# Patient Record
Sex: Female | Born: 1970 | Race: White | Hispanic: No | Marital: Single | Smoking: Never smoker
Health system: Southern US, Community
[De-identification: ages and names within clinical notes are randomized; demographics above are authoritative.]

## PROBLEM LIST (undated history)

## (undated) DIAGNOSIS — E039 Hypothyroidism, unspecified: Secondary | ICD-10-CM

## (undated) HISTORY — DX: Hypothyroidism, unspecified: E03.9

---

## 2006-05-04 ENCOUNTER — Ambulatory Visit: Payer: Self-pay | Admitting: Internal Medicine

## 2006-05-04 LAB — CONVERTED CEMR LAB: TSH: 0.79 microintl units/mL (ref 0.35–5.50)

## 2006-05-06 DIAGNOSIS — E039 Hypothyroidism, unspecified: Secondary | ICD-10-CM | POA: Insufficient documentation

## 2006-05-16 ENCOUNTER — Ambulatory Visit: Payer: Self-pay | Admitting: Internal Medicine

## 2006-05-18 ENCOUNTER — Ambulatory Visit: Payer: Self-pay

## 2007-03-11 ENCOUNTER — Ambulatory Visit: Payer: Self-pay | Admitting: Family Medicine

## 2007-05-01 ENCOUNTER — Telehealth: Payer: Self-pay | Admitting: Internal Medicine

## 2007-06-10 ENCOUNTER — Ambulatory Visit: Payer: Self-pay | Admitting: Internal Medicine

## 2007-06-10 DIAGNOSIS — G43909 Migraine, unspecified, not intractable, without status migrainosus: Secondary | ICD-10-CM | POA: Insufficient documentation

## 2007-06-11 LAB — CONVERTED CEMR LAB
AST: 19 units/L (ref 0–37)
Alkaline Phosphatase: 51 units/L (ref 39–117)
Bilirubin, Direct: 0.1 mg/dL (ref 0.0–0.3)
Chloride: 110 meq/L (ref 96–112)
Eosinophils Absolute: 0.1 10*3/uL (ref 0.0–0.7)
GFR calc Af Amer: 82 mL/min
GFR calc non Af Amer: 67 mL/min
HCT: 38.5 % (ref 36.0–46.0)
Monocytes Absolute: 0.5 10*3/uL (ref 0.1–1.0)
Monocytes Relative: 8.2 % (ref 3.0–12.0)
Neutrophils Relative %: 63.1 % (ref 43.0–77.0)
Platelets: 209 10*3/uL (ref 150–400)
Potassium: 4.4 meq/L (ref 3.5–5.1)
RDW: 13.5 % (ref 11.5–14.6)
Sodium: 141 meq/L (ref 135–145)
WBC: 6.1 10*3/uL (ref 4.5–10.5)

## 2008-01-03 ENCOUNTER — Ambulatory Visit: Payer: Self-pay | Admitting: Internal Medicine

## 2008-06-01 ENCOUNTER — Ambulatory Visit: Payer: Self-pay | Admitting: Internal Medicine

## 2008-08-26 ENCOUNTER — Telehealth: Payer: Self-pay | Admitting: Internal Medicine

## 2009-06-15 ENCOUNTER — Ambulatory Visit: Payer: Self-pay | Admitting: Internal Medicine

## 2009-06-15 LAB — CONVERTED CEMR LAB: Rapid Strep: NEGATIVE

## 2009-08-16 ENCOUNTER — Encounter: Payer: Self-pay | Admitting: Internal Medicine

## 2009-09-08 ENCOUNTER — Ambulatory Visit: Payer: Self-pay | Admitting: Internal Medicine

## 2009-09-08 DIAGNOSIS — G4700 Insomnia, unspecified: Secondary | ICD-10-CM | POA: Insufficient documentation

## 2009-09-09 LAB — CONVERTED CEMR LAB
AST: 14 units/L (ref 0–37)
BUN: 11 mg/dL (ref 6–23)
Basophils Absolute: 0 10*3/uL (ref 0.0–0.1)
Bilirubin, Direct: 0.1 mg/dL (ref 0.0–0.3)
Calcium: 9.3 mg/dL (ref 8.4–10.5)
Cholesterol: 165 mg/dL (ref 0–200)
Creatinine, Ser: 0.9 mg/dL (ref 0.4–1.2)
GFR calc non Af Amer: 73.91 mL/min (ref >60)
Glucose, Bld: 76 mg/dL (ref 70–99)
HCT: 40.5 % (ref 36.0–46.0)
HDL: 49.9 mg/dL (ref >39.00)
LDL Cholesterol: 91 mg/dL (ref 0–99)
Lymphocytes Relative: 27.3 % (ref 12.0–46.0)
Lymphs Abs: 1.3 10*3/uL (ref 0.7–4.0)
Monocytes Relative: 10.1 % (ref 3.0–12.0)
Neutrophils Relative %: 60.8 % (ref 43.0–77.0)
Platelets: 201 10*3/uL (ref 150.0–400.0)
RDW: 13.7 % (ref 11.5–14.6)
TSH: 2.13 microintl units/mL (ref 0.35–5.50)
Total Bilirubin: 0.5 mg/dL (ref 0.3–1.2)
Triglycerides: 121 mg/dL (ref 0.0–149.0)
VLDL: 24.2 mg/dL (ref 0.0–40.0)

## 2009-11-30 ENCOUNTER — Telehealth: Payer: Self-pay | Admitting: Internal Medicine

## 2010-03-01 NOTE — Assessment & Plan Note (Signed)
Summary: SORE THROAT // RS   Vital Signs:  Patient profile:   40 year old female Temp:     98.2 degrees F oral Pulse rate:   76 / minute Pulse rhythm:   regular Resp:     12 per minute BP sitting:   90 / 62  (left arm) Cuff size:   regular  Vitals Entered By: Gladis Riffle, RN (Jun 15, 2009 9:53 AM) CC: c/o sore throat since 5/15, getting worse each day Is Patient Diabetic? No   CC:  c/o sore throat since 5/15 and getting worse each day.  History of Present Illness: ST 4 days progressively worsening no fever hnurts to swallos hurts to talk ibuprofen without results   Preventive Screening-Counseling & Management  Alcohol-Tobacco     Smoking Status: never  Current Problems (verified): 1)  Migraine Headache  (ICD-346.90) 2)  Hypothyroidism  (ICD-244.9)  Current Medications (verified): 1)  Levoxyl 75 Mcg Tabs (Levothyroxine Sodium) .... Take 1 Tablet By Mouth Once A Day 2)  Juice Plus Fibre   Liqd (Nutritional Supplements) .Marland Kitchen.. 1 By Mouth Once Daily 3)  Osteo Bi-Flex Regular Strength 250-200 Mg Tabs (Glucosamine-Chondroitin) .... Two Capsules Per Day  Allergies: 1)  ! Penicillin V Potassium (Penicillin V Potassium) 2)  ! Codeine Sulfate (Codeine Sulfate) 3)  ! 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   Head:  normocephalic and atraumatic.   Mouth:  tonsils large and red no exudate Neck:   supple and full ROM.   Lungs:  normal respiratory effort and no intercostal retractions.   Abdomen:  soft and non-tender.   Cervical Nodes:  no anterior cervical adenopathy and no posterior cervical adenopathy.     Impression & Recommendations:  Problem # 1:  URI (ICD-465.9) will check strep and then make determination strep negative will treat sympotms call if sxs persist/worsen  Complete Medication List: 1)  Levoxyl 75 Mcg Tabs (Levothyroxine sodium) .... Take 1 tablet by mouth once a day 2)  Juice Plus Fibre Liqd (Nutritional supplements) .Marland Kitchen.. 1 by mouth once daily 3)  Osteo Bi-flex Regular Strength 250-200 Mg Tabs (Glucosamine-chondroitin) .... Two capsules per day 4)  Lidocaine Viscous 2 % Soln (Lidocaine hcl) .... Gargle and spit three times a day as needed sore throat  Other Orders: Rapid Strep (21308) Prescriptions: LIDOCAINE VISCOUS 2 % SOLN (LIDOCAINE HCL) gargle and spit three times a day as needed sore throat  #240 cc x 1   Entered by:   Gladis Riffle, RN   Authorized by:   Birdie Sons MD   Signed by:   Gladis Riffle, RN on 06/15/2009   Method used:   [facility]        [facility] [phone][facility] (retail)       [address]       [place], [place]  [address]       Ph: [phone]       Fax: [phone]   RxID:  [id number]   Laboratory Results  Date/Time Received: Jun 15, 2009 10:41 AM  Date/Time Reported: Jun 15, 2009 10:41 AM   Other Tests  Rapid Strep: negative Comments Wynona Canes, CMA  Jun 15, 2009 10:41 AM

## 2010-03-01 NOTE — Assessment & Plan Note (Signed)
Summary: med check/refill/pt coming in fasting/cjr/pt rsc/cjr   Vital Signs:  Patient profile:   40 year old female Weight:      133 pounds BMI:     21.54 Temp:     98.6 degrees F oral Pulse rate:   64 / minute Pulse rhythm:   regular Resp:     12 per minute BP sitting:   96 / 68  (left arm) Cuff size:   regular  Vitals Entered By: Gladis Riffle, RN (September 08, 2009 8:07 AM) CC: medication review and refill; c/o not sleeping--fasting Is Patient Diabetic? No   CC:  medication review and refill; c/o not sleeping--fasting.  History of Present Illness:  Follow-Up Visit      This is a 40 year old woman who presents for Follow-up visit.  The patient denies chest pain and palpitations.  Since the last visit the patient notes no new problems or concerns, except has had trouble sleeping for the past month. Describes both prolonged sleep latency and early awakenings.  The patient reports taking meds as prescribed.  When questioned about possible medication side effects, the patient notes none.    All other systems reviewed and were negative   Preventive Screening-Counseling & Management  Alcohol-Tobacco     Smoking Status: never  Current Problems (verified): 1)  Migraine Headache  (ICD-346.90) 2)  Hypothyroidism  (ICD-244.9)  Current Medications (verified): 1)  Levoxyl 75 Mcg Tabs (Levothyroxine Sodium) .... Take 1 Tablet By Mouth Once A Day 2)  Juice Plus Fibre   Liqd (Nutritional Supplements) .Marland Kitchen.. 1 By Mouth Once Daily 3)  Osteo Bi-Flex Regular Strength 250-200 Mg Tabs (Glucosamine-Chondroitin) .... Two Capsules Per Day  Allergies: 1)  ! Penicillin V Potassium (Penicillin V Potassium) 2)  ! Codeine Sulfate (Codeine Sulfate) 3)  ! 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   Head:  normocephalic and atraumatic.   Eyes:  pupils equal and pupils round.   Ears:  R ear normal and L ear normal.   Neck:  supple and full ROM.   Lungs:  normal respiratory effort and no intercostal retractions.   Heart:  normal rate and regular rhythm.     Impression & Recommendations:  Problem # 1:  HYPOTHYROIDISM (ICD-244.9)  needs labs Her updated medication list for this problem includes:    Levoxyl 75 Mcg Tabs (Levothyroxine sodium) .Marland Kitchen... Take 1 tablet by mouth once a day  Labs Reviewed: TSH: 0.74 (06/01/2008)     Problem # 2:  INSOMNIA-SLEEP DISORDER-UNSPEC (ICD-780.52) discussed sleep habits will try trazadone side effects disucssed  Complete Medication List: 1)  Levoxyl 75 Mcg Tabs (Levothyroxine sodium) .... Take 1 tablet by mouth once a day 2)  Juice Plus Fibre Liqd (Nutritional supplements) .Marland Kitchen.. 1 by mouth once daily 3)  Osteo Bi-flex Regular Strength 250-200 Mg Tabs (Glucosamine-chondroitin) .... Two capsules per day 4)  Trazodone Hcl 50 Mg Tabs (Trazodone hcl) .... 1/2 -1  by mouth at bedtime as needed insomnia  Other Orders: Venipuncture (16109) TLB-Lipid Panel (80061-LIPID) TLB-BMP (Basic Metabolic Panel-BMET) (80048-METABOL) TLB-CBC Platelet - w/Differential (85025-CBCD) TLB-Hepatic/Liver Function Pnl (80076-HEPATIC) TLB-TSH (Thyroid Stimulating  Hormone) (84443-TSH)  Patient Instructions: 1)  . Prescriptions: LEVOXYL 75 MCG TABS (LEVOTHYROXINE SODIUM) Take 1 tablet by mouth once a day Brand medically necessary #90 x 3   Entered and Authorized by:   Birdie Sons MD   Signed by:   Birdie Sons MD on 09/08/2009   Method used:   Faxed to ...       [facility]  (mail-order)             , [place]         Ph: [phone]       Fax: [phone]   RxID:   [id number] TRAZODONE HCL 50 MG TABS (TRAZODONE HCL) 1/2 -1  by mouth at bedtime as needed insomnia  #30 x 1   Entered and Authorized by:   Birdie Sons MD   Signed by:   Birdie Sons MD on 09/08/2009   Method used:   [facility] to        [facility] [phone][facility] (retail)       [address]       [place], [place]  [address]       Ph: [phone]       Fax: [phone]   RxID:   [id number]   Appended Document: Orders Update     Clinical Lists Changes  Orders: Added new Service order of Specimen Handling (06269) - Signed

## 2010-03-01 NOTE — Letter (Signed)
Summary: [facility]  [facility]   Imported By: Maryln Gottron 09/02/2009 15:55:20  _____________________________________________________________________  External Attachment:    Type:   Image     Comment:   External Document

## 2010-03-01 NOTE — Progress Notes (Signed)
Summary: refill  Phone Note Call from Patient Call back at Home Phone [phone]   Caller: Patient----triage vm Summary of Call: was rx'd Trazodone to help her sleep. requesting refills. Not sleeping as well as she should be. please call her back. Initial call taken by: Warnell Forester,  November 30, 2009 9:43 AM  Follow-up for Phone Call        Phone Call Completed, Rx Called In Follow-up by: Alfred Levins, CMA,  November 30, 2009 12:01 PM    Prescriptions: TRAZODONE HCL 50 MG TABS (TRAZODONE HCL) 1/2 -1  by mouth at bedtime as needed insomnia  #30 x 1   Entered by:   Alfred Levins, CMA   Authorized by:   Birdie Sons MD   Signed by:   Alfred Levins, CMA on 11/30/2009   Method used:   [facility] to        [facility] [phone]* (retail)       [address]       [place], [place]  [address]       Ph: [phone]       Fax: [phone]   RxID:   [id number]

## 2010-08-23 ENCOUNTER — Encounter: Payer: Self-pay | Admitting: Internal Medicine

## 2010-08-29 ENCOUNTER — Ambulatory Visit: Payer: Self-pay | Admitting: Internal Medicine

## 2010-08-31 ENCOUNTER — Telehealth: Payer: Self-pay | Admitting: Internal Medicine

## 2010-08-31 MED ORDER — LEVOTHYROXINE SODIUM 75 MCG PO TABS: 75.0000 ug | ORAL_TABLET | Freq: Every day | ORAL | Status: DC

## 2010-08-31 NOTE — Telephone Encounter (Signed)
rx sent in electronically 

## 2010-08-31 NOTE — Telephone Encounter (Signed)
Pt requesting refill on levothyroxine (SYNTHROID, LEVOTHROID) 75 MCG tablet   Send to [facility] home delivery

## 2010-09-01 ENCOUNTER — Ambulatory Visit: Payer: Self-pay | Admitting: Internal Medicine

## 2010-09-01 ENCOUNTER — Other Ambulatory Visit: Payer: Self-pay | Admitting: *Deleted

## 2010-09-01 MED ORDER — LEVOTHYROXINE SODIUM 75 MCG PO TABS: 75.0000 ug | ORAL_TABLET | Freq: Every day | ORAL | Status: DC

## 2010-11-02 ENCOUNTER — Encounter: Payer: Self-pay | Admitting: Internal Medicine

## 2010-11-02 ENCOUNTER — Ambulatory Visit (INDEPENDENT_AMBULATORY_CARE_PROVIDER_SITE_OTHER): Payer: Managed Care, Other (non HMO) | Admitting: Internal Medicine

## 2010-11-02 DIAGNOSIS — E039 Hypothyroidism, unspecified: Secondary | ICD-10-CM

## 2010-11-02 DIAGNOSIS — Z0000 Encounter for general adult medical examination without abnormal findings: Secondary | ICD-10-CM

## 2010-11-02 LAB — BASIC METABOLIC PANEL
BUN: 25 mg/dL — ABNORMAL HIGH (ref 6–23)
Calcium: 9.2 mg/dL (ref 8.4–10.5)
Chloride: 105 mEq/L (ref 96–112)
Creatinine, Ser: 0.9 mg/dL (ref 0.4–1.2)
GFR: 74.4 mL/min (ref >60.00)
Glucose, Bld: 75 mg/dL (ref 70–99)
Potassium: 4.1 mEq/L (ref 3.5–5.1)
Sodium: 140 mEq/L (ref 135–145)

## 2010-11-02 LAB — CBC WITH DIFFERENTIAL/PLATELET
Basophils Absolute: 0 10*3/uL (ref 0.0–0.1)
HCT: 41.5 % (ref 36.0–46.0)
Hemoglobin: 13.7 g/dL (ref 12.0–15.0)
Lymphs Abs: 1.5 10*3/uL (ref 0.7–4.0)
MCV: 90.2 fl (ref 78.0–100.0)
Monocytes Absolute: 0.3 10*3/uL (ref 0.1–1.0)
Monocytes Relative: 7.3 % (ref 3.0–12.0)
Neutro Abs: 1.9 10*3/uL (ref 1.4–7.7)
RDW: 13.3 % (ref 11.5–14.6)

## 2010-11-02 LAB — LIPID PANEL
Cholesterol: 200 mg/dL (ref 0–200)
HDL: 78.8 mg/dL (ref >39.00)
Total CHOL/HDL Ratio: 3
Triglycerides: 97 mg/dL (ref 0.0–149.0)

## 2010-11-02 LAB — HEPATIC FUNCTION PANEL
Albumin: 4.1 g/dL (ref 3.5–5.2)
Alkaline Phosphatase: 59 U/L (ref 39–117)
Total Protein: 7.3 g/dL (ref 6.0–8.3)

## 2010-11-02 LAB — TSH: TSH: 0.64 u[IU]/mL (ref 0.35–5.50)

## 2010-11-02 NOTE — Progress Notes (Signed)
  Subjective:    Patient ID: Susan Gonzalez, female    DOB: [birth year], 40 y.o.   MRN: [id number]  HPI  Hypothyroid---taking replacement She really has no other complaints.  Past Medical History  Diagnosis Date  . Hypothyroidism    No past surgical history on file.  reports that she has never smoked. She does not have any smokeless tobacco history on file. She reports that she drinks alcohol. Her drug history not on file. family history includes Cancer in her maternal grandmother; Hypertension in her father and mother; and Stroke in her paternal grandfather and paternal grandmother. Allergies  Allergen Reactions  . Codeine Sulfate     REACTION: unspecified  . Cyclobenzaprine Hcl     REACTION: migraine  . Penicillins     REACTION: unspecified     Review of Systems    patient denies chest pain, shortness of breath, orthopnea. Denies lower extremity edema, abdominal pain, change in appetite, change in bowel movements. Patient denies rashes, musculoskeletal complaints. No other specific complaints in a complete review of systems.    Objective:   Physical Exam   Well-developed well-nourished female in no acute distress. HEENT exam atraumatic, normocephalic, extraocular muscles are intact. Neck is supple. No jugular venous distention no thyromegaly. Chest clear to auscultation without increased work of breathing. Cardiac exam S1 and S2 are regular. Abdominal exam active bowel sounds, soft, nontender. Extremities no edema.      Assessment & Plan:

## 2010-11-02 NOTE — Assessment & Plan Note (Signed)
Lab Results  Component Value Date   TSH 2.13 09/08/2009   Need f/u today

## 2011-07-11 ENCOUNTER — Telehealth: Payer: Self-pay | Admitting: Internal Medicine

## 2011-07-11 MED ORDER — LEVOTHYROXINE SODIUM 75 MCG PO TABS: 75.0000 ug | ORAL_TABLET | Freq: Every day | ORAL | Status: DC

## 2011-07-11 NOTE — Telephone Encounter (Signed)
Pt called req refill of levothyroxine (SYNTHROID, LEVOTHROID) [facility] MCG tablet [facility]. Pt is out of med.

## 2011-07-11 NOTE — Telephone Encounter (Signed)
rx sent in electronically 

## 2012-01-09 ENCOUNTER — Telehealth: Payer: Self-pay | Admitting: Internal Medicine

## 2012-01-09 DIAGNOSIS — E039 Hypothyroidism, unspecified: Secondary | ICD-10-CM

## 2012-01-09 MED ORDER — LEVOTHYROXINE SODIUM 75 MCG PO TABS: 75.0000 ug | ORAL_TABLET | Freq: Every day | ORAL | Status: DC

## 2012-01-09 NOTE — Telephone Encounter (Signed)
rx sent in electronically 

## 2012-01-09 NOTE — Telephone Encounter (Signed)
Pt has appt scheduled 12/20 for follow up thyroid medication.  She will run out of medication before appt and is requesting a refill of levothyroxine tab [facility] [address].

## 2012-01-18 ENCOUNTER — Ambulatory Visit (INDEPENDENT_AMBULATORY_CARE_PROVIDER_SITE_OTHER): Payer: Managed Care, Other (non HMO) | Admitting: Internal Medicine

## 2012-01-18 ENCOUNTER — Encounter: Payer: Self-pay | Admitting: Internal Medicine

## 2012-01-18 VITALS — BP 108/66 | HR 56 | Temp 98.0°F | Wt 143.0 lb

## 2012-01-18 DIAGNOSIS — G4700 Insomnia, unspecified: Secondary | ICD-10-CM

## 2012-01-18 DIAGNOSIS — E039 Hypothyroidism, unspecified: Secondary | ICD-10-CM

## 2012-01-18 MED ORDER — ZALEPLON 5 MG PO CAPS: 5.0000 mg | ORAL_CAPSULE | Freq: Every day | ORAL | Status: DC

## 2012-01-18 NOTE — Assessment & Plan Note (Signed)
Doing well. Check labs today.

## 2012-01-18 NOTE — Assessment & Plan Note (Signed)
Continues to be a problem. I'm going to give her Sonata. She will call me if her symptoms persist. She's given detailed instructions on how to use Sonata an as-needed basis.

## 2012-01-18 NOTE — Progress Notes (Signed)
Patient ID: Susan Gonzalez, female   DOB: 1971-01-04, 41 y.o.   MRN: [id number] Thyroid- needs f/u  Sleep- intermittent poor sleep. Affects work Affects 5/7 nights.  She does not nap, she exercises a lot.  Reviewed pmh, psh, sochx

## 2012-01-19 ENCOUNTER — Ambulatory Visit: Payer: Managed Care, Other (non HMO) | Admitting: Internal Medicine

## 2012-02-14 ENCOUNTER — Other Ambulatory Visit: Payer: Self-pay | Admitting: Internal Medicine

## 2012-03-17 ENCOUNTER — Other Ambulatory Visit: Payer: Self-pay | Admitting: Internal Medicine

## 2012-04-30 ENCOUNTER — Other Ambulatory Visit: Payer: Self-pay | Admitting: Internal Medicine

## 2012-06-13 ENCOUNTER — Other Ambulatory Visit: Payer: Self-pay | Admitting: Internal Medicine

## 2012-08-05 ENCOUNTER — Other Ambulatory Visit: Payer: Self-pay | Admitting: Internal Medicine

## 2012-12-03 ENCOUNTER — Telehealth: Payer: Self-pay | Admitting: Internal Medicine

## 2012-12-03 NOTE — Telephone Encounter (Signed)
Pt would like to be tested for add. Would like referral to a doc that will do this

## 2012-12-03 NOTE — Telephone Encounter (Signed)
Psychology referral for  ADD Susan Gonzalez

## 2012-12-04 NOTE — Telephone Encounter (Signed)
Left message on machine For pt to return call-- pt makes own appointment- Susan Gonzalez telephone number is [phone]

## 2012-12-04 NOTE — Telephone Encounter (Signed)
Pt informed

## 2013-01-07 ENCOUNTER — Other Ambulatory Visit: Payer: Self-pay | Admitting: Internal Medicine

## 2013-02-12 ENCOUNTER — Ambulatory Visit: Payer: Managed Care, Other (non HMO) | Admitting: Internal Medicine

## 2013-02-21 ENCOUNTER — Ambulatory Visit: Payer: Managed Care, Other (non HMO) | Admitting: Internal Medicine

## 2013-05-07 ENCOUNTER — Telehealth: Payer: Self-pay | Admitting: Internal Medicine

## 2013-05-07 MED ORDER — LEVOTHYROXINE SODIUM 75 MCG PO TABS: ORAL_TABLET | ORAL | Status: DC

## 2013-05-07 NOTE — Telephone Encounter (Signed)
rx sent in electronically 

## 2013-05-07 NOTE — Telephone Encounter (Signed)
Pt needs refill of levothyroxine (SYNTHROID, LEVOTHROID) 75 MCG tablet [facility] [address] Pt has made appt w/ padonda for fup on meds on 4/21.  Can you refill  Until then?

## 2013-05-20 ENCOUNTER — Encounter: Payer: Self-pay | Admitting: Family

## 2013-05-20 ENCOUNTER — Ambulatory Visit (INDEPENDENT_AMBULATORY_CARE_PROVIDER_SITE_OTHER): Payer: Managed Care, Other (non HMO) | Admitting: Family

## 2013-05-20 VITALS — BP 100/60 | HR 70 | Wt 150.0 lb

## 2013-05-20 DIAGNOSIS — G4700 Insomnia, unspecified: Secondary | ICD-10-CM

## 2013-05-20 DIAGNOSIS — E039 Hypothyroidism, unspecified: Secondary | ICD-10-CM

## 2013-05-20 LAB — COMPREHENSIVE METABOLIC PANEL
ALT: 17 U/L (ref 0–35)
AST: 20 U/L (ref 0–37)
Albumin: 3.9 g/dL (ref 3.5–5.2)
Alkaline Phosphatase: 50 U/L (ref 39–117)
BUN: 20 mg/dL (ref 6–23)
CALCIUM: 9.1 mg/dL (ref 8.4–10.5)
CHLORIDE: 104 meq/L (ref 96–112)
CO2: 27 meq/L (ref 19–32)
CREATININE: 1 mg/dL (ref 0.4–1.2)
GFR: 65.78 mL/min (ref >60.00)
GLUCOSE: 73 mg/dL (ref 70–99)
Potassium: 3.8 mEq/L (ref 3.5–5.1)
Sodium: 139 mEq/L (ref 135–145)
Total Bilirubin: 0.6 mg/dL (ref 0.3–1.2)
Total Protein: 7 g/dL (ref 6.0–8.3)

## 2013-05-20 LAB — TSH: TSH: 0.82 u[IU]/mL (ref 0.35–5.50)

## 2013-05-20 MED ORDER — LEVOTHYROXINE SODIUM 75 MCG PO TABS: ORAL_TABLET | ORAL | Status: DC

## 2013-05-20 MED ORDER — ZALEPLON 5 MG PO CAPS: 5.0000 mg | ORAL_CAPSULE | Freq: Every day | ORAL | Status: DC

## 2013-05-20 NOTE — Progress Notes (Signed)
   Subjective:    Patient ID: Susan Gonzalez, female    DOB: 27-Jan-1971, 43 y.o.   MRN: [id number]  HPI  43 year old caucasian female, nonsmoker presenting today for f/u for hypothyroidism.  She is in office today because needs a refill.  She has been taking her medication as prescribed and is tolerating it well.      Review of Systems  Constitutional: Negative.  Negative for appetite change and unexpected weight change.  Respiratory: Negative.   Cardiovascular: Positive for leg swelling.       Right leg swells at times  Gastrointestinal: Negative.   Endocrine: Negative.  Negative for cold intolerance and heat intolerance.  Genitourinary: Negative.  Negative for menstrual problem.  Musculoskeletal: Negative.   Neurological: Negative.  Negative for light-headedness and headaches.  Hematological: Negative.   Psychiatric/Behavioral: Positive for sleep disturbance.       Problems with sleep   Past Medical History  Diagnosis Date  . Hypothyroidism     History   Social History  . Marital Status: Single    Spouse Name: N/A    Number of Children: N/A  . Years of Education: N/A   Occupational History  . Not on file.   Social History Main Topics  . Smoking status: Never Smoker   . Smokeless tobacco: Not on file  . Alcohol Use: Yes  . Drug Use: Not on file  . Sexual Activity: Not on file   Other Topics Concern  . Not on file   Social History Narrative  . No narrative on file    History reviewed. No pertinent past surgical history.  Family History  Problem Relation Age of Onset  . Hypertension Mother   . Hypertension Father   . Cancer Maternal Grandmother     breast  . Stroke Paternal Grandmother   . Stroke Paternal Grandfather     Allergies  Allergen Reactions  . Codeine Sulfate     REACTION: unspecified  . Cyclobenzaprine Hcl     REACTION: migraine  . Penicillins     REACTION: unspecified    No current outpatient prescriptions on file prior to visit.     No current facility-administered medications on file prior to visit.    BP 100/60  Pulse 70  Wt 150 lb (68.04 kg)  SpO2 98%chart    Objective:   Physical Exam  Constitutional: She is oriented to person, place, and time. She appears well-developed and well-nourished.  HENT:  Head: Normocephalic.  Neck: Normal range of motion. Neck supple.  Cardiovascular: Normal rate, regular rhythm and normal heart sounds.   Pulmonary/Chest: Effort normal and breath sounds normal.  Abdominal: Soft. Bowel sounds are normal.  Musculoskeletal: Normal range of motion.  Neurological: She is alert and oriented to person, place, and time. She has normal reflexes. No cranial nerve deficit.  Skin: Skin is warm and dry.  Psychiatric: She has a normal mood and affect. Her behavior is normal. Judgment and thought content normal.          Assessment & Plan:  Assessment 1. Hypothyroidism 2.Insomnia   Plan 1.Refills on synthroid and sonata. 2.Obtain lab that include TSH.  3.Schedule for physical over the next few months. 4. Contact office for questions or concerns.

## 2013-05-20 NOTE — Progress Notes (Signed)
Pre visit review using our clinic review tool, if applicable. No additional management support is needed unless otherwise documented below in the visit note. 

## 2013-05-20 NOTE — Patient Instructions (Signed)

## 2013-05-30 ENCOUNTER — Other Ambulatory Visit: Payer: Self-pay | Admitting: Internal Medicine

## 2013-08-08 ENCOUNTER — Ambulatory Visit (HOSPITAL_BASED_OUTPATIENT_CLINIC_OR_DEPARTMENT_OTHER)
Admission: RE | Admit: 2013-08-08 | Discharge: 2013-08-08 | Disposition: A | Discharge status: Home / Self Care | Payer: Managed Care, Other (non HMO) | Source: Ambulatory Visit | Attending: Family Medicine | Admitting: Family Medicine

## 2013-08-08 ENCOUNTER — Encounter: Payer: Self-pay | Admitting: Family Medicine

## 2013-08-08 ENCOUNTER — Ambulatory Visit (INDEPENDENT_AMBULATORY_CARE_PROVIDER_SITE_OTHER): Payer: Managed Care, Other (non HMO) | Admitting: Family Medicine

## 2013-08-08 VITALS — BP 100/68 | HR 65 | Temp 98.0°F | Resp 16 | Wt 150.1 lb

## 2013-08-08 DIAGNOSIS — M79609 Pain in unspecified limb: Secondary | ICD-10-CM | POA: Insufficient documentation

## 2013-08-08 DIAGNOSIS — M7989 Other specified soft tissue disorders: Secondary | ICD-10-CM | POA: Insufficient documentation

## 2013-08-08 MED ORDER — MELOXICAM 15 MG PO TABS: 15.0000 mg | ORAL_TABLET | Freq: Every day | ORAL | Status: DC

## 2013-08-08 NOTE — Progress Notes (Signed)
Pre visit review using our clinic review tool, if applicable. No additional management support is needed unless otherwise documented below in the visit note. 

## 2013-08-08 NOTE — Patient Instructions (Signed)
Follow up as needed Your ankle swelling appears to be bursitis- start the Mobic daily w/ food, ICE, elevate. We will call you with your US appt to assess the lump behind your knee Call with any questions or concerns Hang in there!!!

## 2013-08-08 NOTE — Progress Notes (Signed)
   Subjective:    Patient ID: Susan Gonzalez, female    DOB: [birth year], 43 y.o.   MRN: [id number]  HPI R foot and ankle swelling- sxs started ~2 weeks ago.  No known injury.  Swelling will occur at ankle both medially and laterally.  Has area of discoloration and TTP just inferior to medial malleolus.  Not painful to weight bear.  No change in footwear.  No change in activity level but pt is VERY active.  Pt has noted tender lump posterior to R knee but this predated the swelling.   Review of Systems For ROS see HPI     Objective:   Physical Exam  Vitals reviewed. Constitutional: She is oriented to person, place, and time. She appears well-developed and well-nourished. No distress.  Cardiovascular: Intact distal pulses.   Musculoskeletal: She exhibits edema (edema inferior to R medial malleolus consistent w/ bursitis) and tenderness (TTP over R medial malleolus bursa).  Small firm, mildly TTP area posterior to R knee possibly a Baker's cyst vs ganglion  Neurological: She is alert and oriented to person, place, and time. She has normal reflexes. No cranial nerve deficit. Coordination normal.  Skin: Skin is warm and dry. No erythema.  Psychiatric: She has a normal mood and affect. Her behavior is normal.          Assessment & Plan:

## 2013-08-10 NOTE — Assessment & Plan Note (Signed)
New.  Pt's lower leg swelling is consistent w/ bursitis but due to tender nodule posterior to R knee will get US to r/o DVT.  Start scheduled NSAIDs, ice, elevation, and good supportive shoes.  Reviewed supportive care and red flags that should prompt return.  Pt expressed understanding and is in agreement w/ plan.

## 2013-08-18 ENCOUNTER — Telehealth: Payer: Self-pay | Admitting: Family Medicine

## 2013-08-18 DIAGNOSIS — M6740 Ganglion, unspecified site: Secondary | ICD-10-CM

## 2013-08-18 NOTE — Telephone Encounter (Signed)
Caller name: Lawson FiscalLori  Call back [phone]   Reason for call:   Pt would like a referral to a specialist related to the OV on 7/10.  She does not have one in mind

## 2013-08-18 NOTE — Telephone Encounter (Signed)
Referral placed and pt notified

## 2013-08-22 LAB — HM PAP SMEAR: HM Pap smear: NORMAL

## 2013-08-22 LAB — HM MAMMOGRAPHY

## 2013-10-14 ENCOUNTER — Other Ambulatory Visit: Payer: Self-pay | Admitting: Internal Medicine

## 2014-02-26 ENCOUNTER — Other Ambulatory Visit: Payer: Self-pay | Admitting: Family

## 2014-06-11 ENCOUNTER — Other Ambulatory Visit: Payer: Self-pay | Admitting: Internal Medicine

## 2014-07-28 ENCOUNTER — Ambulatory Visit (INDEPENDENT_AMBULATORY_CARE_PROVIDER_SITE_OTHER): Payer: Managed Care, Other (non HMO) | Admitting: Family Medicine

## 2014-07-28 ENCOUNTER — Encounter: Payer: Self-pay | Admitting: Family Medicine

## 2014-07-28 VITALS — BP 100/68 | HR 61 | Temp 98.3°F | Resp 16 | Ht 66.0 in | Wt 158.2 lb

## 2014-07-28 DIAGNOSIS — E038 Other specified hypothyroidism: Secondary | ICD-10-CM

## 2014-07-28 DIAGNOSIS — R635 Abnormal weight gain: Secondary | ICD-10-CM

## 2014-07-28 DIAGNOSIS — R5383 Other fatigue: Secondary | ICD-10-CM | POA: Diagnosis not present

## 2014-07-28 LAB — CBC WITH DIFFERENTIAL/PLATELET
Basophils Absolute: 0 10*3/uL (ref 0.0–0.1)
Basophils Relative: 0.6 % (ref 0.0–3.0)
EOS PCT: 4.6 % (ref 0.0–5.0)
Eosinophils Absolute: 0.3 10*3/uL (ref 0.0–0.7)
HEMATOCRIT: 43.1 % (ref 36.0–46.0)
Hemoglobin: 14.5 g/dL (ref 12.0–15.0)
LYMPHS PCT: 27.6 % (ref 12.0–46.0)
Lymphs Abs: 2 10*3/uL (ref 0.7–4.0)
MCHC: 33.6 g/dL (ref 30.0–36.0)
MCV: 88.1 fl (ref 78.0–100.0)
MONOS PCT: 7.5 % (ref 3.0–12.0)
Monocytes Absolute: 0.5 10*3/uL (ref 0.1–1.0)
Neutro Abs: 4.3 10*3/uL (ref 1.4–7.7)
Neutrophils Relative %: 59.7 % (ref 43.0–77.0)
PLATELETS: 255 10*3/uL (ref 150.0–400.0)
RBC: 4.89 Mil/uL (ref 3.87–5.11)
RDW: 14.8 % (ref 11.5–15.5)
WBC: 7.3 10*3/uL (ref 4.0–10.5)

## 2014-07-28 LAB — BASIC METABOLIC PANEL
BUN: 30 mg/dL — ABNORMAL HIGH (ref 6–23)
CALCIUM: 9.4 mg/dL (ref 8.4–10.5)
CO2: 26 mEq/L (ref 19–32)
Chloride: 104 mEq/L (ref 96–112)
Creatinine, Ser: 1.07 mg/dL (ref 0.40–1.20)
GFR: 59.79 mL/min — AB (ref >60.00)
Glucose, Bld: 81 mg/dL (ref 70–99)
Potassium: 3.9 mEq/L (ref 3.5–5.1)
Sodium: 137 mEq/L (ref 135–145)

## 2014-07-28 LAB — HEPATIC FUNCTION PANEL
ALBUMIN: 4.4 g/dL (ref 3.5–5.2)
ALK PHOS: 64 U/L (ref 39–117)
ALT: 25 U/L (ref 0–35)
AST: 20 U/L (ref 0–37)
BILIRUBIN DIRECT: 0 mg/dL (ref 0.0–0.3)
BILIRUBIN TOTAL: 0.4 mg/dL (ref 0.2–1.2)
TOTAL PROTEIN: 7.3 g/dL (ref 6.0–8.3)

## 2014-07-28 LAB — TSH: TSH: 3.51 u[IU]/mL (ref 0.35–4.50)

## 2014-07-28 LAB — LIPID PANEL
CHOLESTEROL: 147 mg/dL (ref 0–200)
HDL: 45.5 mg/dL (ref >39.00)
LDL Cholesterol: 65 mg/dL (ref 0–99)
NonHDL: 101.5
TRIGLYCERIDES: 183 mg/dL — AB (ref 0.0–149.0)
Total CHOL/HDL Ratio: 3
VLDL: 36.6 mg/dL (ref 0.0–40.0)

## 2014-07-28 NOTE — Progress Notes (Signed)
Pre visit review using our clinic review tool, if applicable. No additional management support is needed unless otherwise documented below in the visit note. 

## 2014-07-28 NOTE — Progress Notes (Signed)
   Subjective:    Patient ID: Susan Gonzalez, female    DOB: [birth year], 44 y.o.   MRN: [id number]  HPI Fatigue- pt has hx of hypothyroidism, ran out of synthroid 1-2 months ago.  Increased hair loss.  + constipation.  Very stressed out at work.  No CP, SOB.  + HAs.  Overweight- pt has gained 8 lbs since last visit.  Exercising regularly.     Review of Systems For ROS see HPI     Objective:   Physical Exam  Constitutional: She is oriented to person, place, and time. She appears well-developed and well-nourished. No distress.  HENT:  Head: Normocephalic and atraumatic.  Eyes: Conjunctivae and EOM are normal. Pupils are equal, round, and reactive to light.  Neck: Normal range of motion. Neck supple. No thyromegaly present.  Cardiovascular: Normal rate, regular rhythm, normal heart sounds and intact distal pulses.   No murmur heard. Pulmonary/Chest: Effort normal and breath sounds normal. No respiratory distress.  Abdominal: Soft. She exhibits no distension. There is no tenderness.  Musculoskeletal: She exhibits no edema.  Lymphadenopathy:    She has no cervical adenopathy.  Neurological: She is alert and oriented to person, place, and time.  Skin: Skin is warm and dry.  Psychiatric: She has a normal mood and affect. Her behavior is normal.  Vitals reviewed.         Assessment & Plan:

## 2014-07-28 NOTE — Assessment & Plan Note (Signed)
Chronic problem.  Pt ran out of meds 1-2 months ago.  Having fatigue, weight gain, constipation.  Will check labs and determine appropriate starting dose.  Stressed that she needs to take medication regularly.  Will follow.

## 2014-07-28 NOTE — Assessment & Plan Note (Signed)
New.  Suspect this is due to the fact that she ran out of her thyroid medication.  Will r/o anemia, electrolyte abnormality, or other metabolic cause.  Restart thyroid meds once level available to determine appropriate dose.

## 2014-07-28 NOTE — Assessment & Plan Note (Signed)
New.  Suspect this is due to her lack of thyroid meds x1-2 months.  Pt is very fit but BMI now places her in overweight range.  Based on this, will get labs to risk stratify.  Encouraged pt to continue her healthy diet and regular exercise.  Will follow.

## 2014-07-28 NOTE — Patient Instructions (Signed)
Schedule your complete physical in 6 months We'll notify you of your lab results and make any changes if needed We'll restart the thyroid med once we see where the levels are Keep up the good work on healthy diet and regular exercise Call with any questions or concerns Have a great summer!!!

## 2014-07-29 ENCOUNTER — Telehealth: Payer: Self-pay

## 2014-07-29 MED ORDER — LEVOTHYROXINE SODIUM 75 MCG PO TABS: ORAL_TABLET | ORAL | Status: DC

## 2014-07-29 MED ORDER — ZALEPLON 5 MG PO CAPS: 5.0000 mg | ORAL_CAPSULE | Freq: Every day | ORAL | Status: AC

## 2014-07-29 NOTE — Telephone Encounter (Signed)
[facility] [address] [facility] [address].  Rx sent.  Fax verification received.  No further needs voiced.

## 2014-07-29 NOTE — Telephone Encounter (Signed)
Pt agreed to restart levothyroxine (SYNTHROID, LEVOTHROID) 75 MCG tablet by mouth daily.  [facility] [address].  Pt also requested a new script for Sonata.  Verbal order given by Dr. Beverely Lowabori to refill fpr 25 tablets, 1 refill.  Script printed.  Signed by Dr. Beverely Lowabori.

## 2015-01-27 ENCOUNTER — Telehealth: Payer: Self-pay | Admitting: Behavioral Health

## 2015-01-27 NOTE — Telephone Encounter (Signed)
Attempted to reach patient for Pre-Visit Call. Voice mailbox is full and cannot accept any messages at this time.

## 2015-01-28 ENCOUNTER — Ambulatory Visit (INDEPENDENT_AMBULATORY_CARE_PROVIDER_SITE_OTHER): Payer: Managed Care, Other (non HMO) | Admitting: Family Medicine

## 2015-01-28 ENCOUNTER — Encounter: Payer: Self-pay | Admitting: Family Medicine

## 2015-01-28 VITALS — BP 104/68 | HR 72 | Temp 98.0°F | Resp 16 | Ht 66.0 in | Wt 160.1 lb

## 2015-01-28 DIAGNOSIS — Z0000 Encounter for general adult medical examination without abnormal findings: Secondary | ICD-10-CM

## 2015-01-28 LAB — BASIC METABOLIC PANEL
BUN: 19 mg/dL (ref 6–23)
CALCIUM: 9.5 mg/dL (ref 8.4–10.5)
CHLORIDE: 103 meq/L (ref 96–112)
CO2: 28 meq/L (ref 19–32)
Creatinine, Ser: 1.25 mg/dL — ABNORMAL HIGH (ref 0.40–1.20)
GFR: 49.85 mL/min — ABNORMAL LOW (ref >60.00)
Glucose, Bld: 81 mg/dL (ref 70–99)
Potassium: 4.1 mEq/L (ref 3.5–5.1)
SODIUM: 138 meq/L (ref 135–145)

## 2015-01-28 LAB — CBC WITH DIFFERENTIAL/PLATELET
BASOS ABS: 0 10*3/uL (ref 0.0–0.1)
Basophils Relative: 0.7 % (ref 0.0–3.0)
EOS ABS: 0.1 10*3/uL (ref 0.0–0.7)
Eosinophils Relative: 2.5 % (ref 0.0–5.0)
HEMATOCRIT: 45.2 % (ref 36.0–46.0)
Hemoglobin: 15.3 g/dL — ABNORMAL HIGH (ref 12.0–15.0)
LYMPHS PCT: 35.9 % (ref 12.0–46.0)
Lymphs Abs: 2.1 10*3/uL (ref 0.7–4.0)
MCHC: 33.8 g/dL (ref 30.0–36.0)
MCV: 87 fl (ref 78.0–100.0)
MONO ABS: 0.4 10*3/uL (ref 0.1–1.0)
Monocytes Relative: 6.1 % (ref 3.0–12.0)
NEUTROS ABS: 3.2 10*3/uL (ref 1.4–7.7)
NEUTROS PCT: 54.8 % (ref 43.0–77.0)
PLATELETS: 239 10*3/uL (ref 150.0–400.0)
RBC: 5.19 Mil/uL — ABNORMAL HIGH (ref 3.87–5.11)
RDW: 14.6 % (ref 11.5–15.5)
WBC: 5.8 10*3/uL (ref 4.0–10.5)

## 2015-01-28 LAB — LIPID PANEL
CHOL/HDL RATIO: 3
Cholesterol: 184 mg/dL (ref 0–200)
HDL: 58.8 mg/dL (ref >39.00)
LDL Cholesterol: 112 mg/dL — ABNORMAL HIGH (ref 0–99)
NonHDL: 125.55
Triglycerides: 70 mg/dL (ref 0.0–149.0)
VLDL: 14 mg/dL (ref 0.0–40.0)

## 2015-01-28 LAB — HEPATIC FUNCTION PANEL
ALT: 47 U/L — AB (ref 0–35)
AST: 36 U/L (ref 0–37)
Albumin: 4.3 g/dL (ref 3.5–5.2)
Alkaline Phosphatase: 80 U/L (ref 39–117)
BILIRUBIN DIRECT: 0.1 mg/dL (ref 0.0–0.3)
Total Bilirubin: 0.7 mg/dL (ref 0.2–1.2)
Total Protein: 7.2 g/dL (ref 6.0–8.3)

## 2015-01-28 LAB — VITAMIN D 25 HYDROXY (VIT D DEFICIENCY, FRACTURES): VITD: 42.13 ng/mL (ref 30.00–100.00)

## 2015-01-28 LAB — TSH: TSH: 2.08 u[IU]/mL (ref 0.35–4.50)

## 2015-01-28 NOTE — Progress Notes (Signed)
   Subjective:    Patient ID: Susan Gonzalez, female    DOB: 1970-09-05, 44 y.o.   MRN: [id number]  HPI CPE- UTD on GYN Wylene Simmer([place]).  Due for mammo- plans to schedule w/ Dr Wylene [facility]mer[place].  Declines flu shot.   Review of Systems Patient reports no vision/ hearing changes, adenopathy,fever, weight change,  persistant/recurrent hoarseness , swallowing issues, chest pain, palpitations, edema, persistant/recurrent cough, hemoptysis, dyspnea (rest/exertional/paroxysmal nocturnal), gastrointestinal bleeding (melena, rectal bleeding), abdominal pain, significant heartburn, bowel changes, GU symptoms (dysuria, hematuria, incontinence), Gyn symptoms (abnormal  bleeding, pain),  syncope, focal weakness, memory loss, numbness & tingling, hair/nail changes, abnormal bruising or bleeding, anxiety, or depression.   + hives- idiopathic.  No changes to soaps, lotions, detergents.  Pt admits to increased stress.    Objective:   Physical Exam General Appearance:    Alert, cooperative, no distress, appears stated age  Head:    Normocephalic, without obvious abnormality, atraumatic  Eyes:    PERRL, conjunctiva/corneas clear, EOM's intact, fundi    benign, both eyes  Ears:    Normal TM's and external ear canals, both ears  Nose:   Nares normal, septum midline, mucosa normal, no drainage    or sinus tenderness  Throat:   Lips, mucosa, and tongue normal; teeth and gums normal  Neck:   Supple, symmetrical, trachea midline, no adenopathy;    Thyroid: no enlargement/tenderness/nodules  Back:     Symmetric, no curvature, ROM normal, no CVA tenderness  Lungs:     Clear to auscultation bilaterally, respirations unlabored  Chest Wall:    No tenderness or deformity   Heart:    Regular rate and rhythm, S1 and S2 normal, no murmur, rub   or gallop  Breast Exam:    Deferred to GYN  Abdomen:     Soft, non-tender, bowel sounds active all four quadrants,    no masses, no organomegaly  Genitalia:    Deferred to GYN  Rectal:      Extremities:   Extremities normal, atraumatic, no cyanosis or edema  Pulses:   2+ and symmetric all extremities  Skin:   Skin color, texture, turgor normal, no rashes or lesions  Lymph nodes:   Cervical, supraclavicular, and axillary nodes normal  Neurologic:   CNII-XII intact, normal strength, sensation and reflexes    throughout          Assessment & Plan:

## 2015-01-28 NOTE — Assessment & Plan Note (Signed)
Pt's PE WNL.  UTD on pap.  Overdue on mammo- pt plans to schedule w/ GYN.  Check labs.  Anticipatory guidance provided.

## 2015-01-28 NOTE — Progress Notes (Signed)
Pre visit review using our clinic review tool, if applicable. No additional management support is needed unless otherwise documented below in the visit note. 

## 2015-01-28 NOTE — Patient Instructions (Signed)
Follow up in 1 year (or as needed) We'll notify you of your lab results and make any changes if needed Keep up the good work on healthy diet and regular exercise- you look great! Schedule your mammogram at your convenience Call with any questions or concerns If you want to join us at the new [place] office, any scheduled appointments will automatically transfer and we will see you at [address], [place], [place] [id number] (OPENING FEB) Happy New Year!!!

## 2015-02-02 ENCOUNTER — Encounter: Payer: Self-pay | Admitting: General Practice

## 2015-02-02 ENCOUNTER — Other Ambulatory Visit: Payer: Self-pay | Admitting: Family Medicine

## 2015-02-02 DIAGNOSIS — R7989 Other specified abnormal findings of blood chemistry: Secondary | ICD-10-CM

## 2015-02-02 DIAGNOSIS — R945 Abnormal results of liver function studies: Principal | ICD-10-CM

## 2015-02-09 ENCOUNTER — Other Ambulatory Visit: Payer: Self-pay | Admitting: Family Medicine

## 2015-02-09 ENCOUNTER — Other Ambulatory Visit: Payer: Self-pay

## 2015-02-09 MED ORDER — LEVOTHYROXINE SODIUM 75 MCG PO TABS: ORAL_TABLET | ORAL | Status: DC

## 2015-07-26 ENCOUNTER — Other Ambulatory Visit: Payer: Self-pay | Admitting: General Practice

## 2015-07-26 MED ORDER — LEVOTHYROXINE SODIUM 75 MCG PO TABS: ORAL_TABLET | ORAL | Status: DC

## 2015-08-01 IMAGING — US US EXTREM LOW VENOUS*R*
1 series · 13 of 24 positions shown · non-contrast
Comparison: None.

ADDENDUM:
This addendum is given for the purpose of noting a tiny cystic
lesion measuring 0.8 x 0.4 x 0.7 cm is seen in the region of concern
posterior to the knee and may represent a tiny Baker's cyst root
ganglion cyst. It is benign appearance.
CLINICAL DATA: Right lower extremity pain and swelling and a lump
posterior to the right knee for 1 year.



[Series 1: us extrem low venous*right* · 46 acquisitions, 13 frames shown]
[im 1/46]
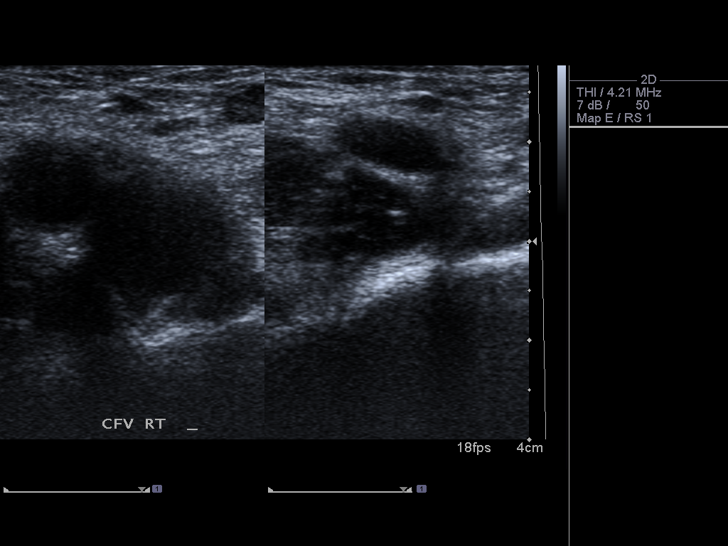
[im 4/46]
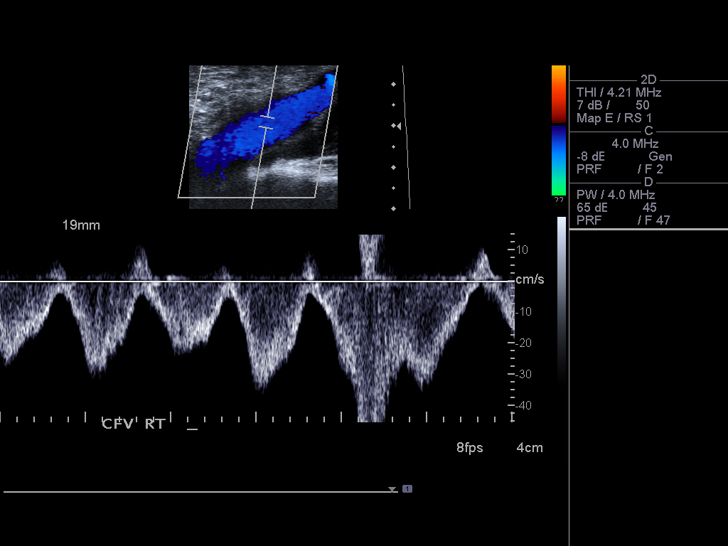
[im 8/46]
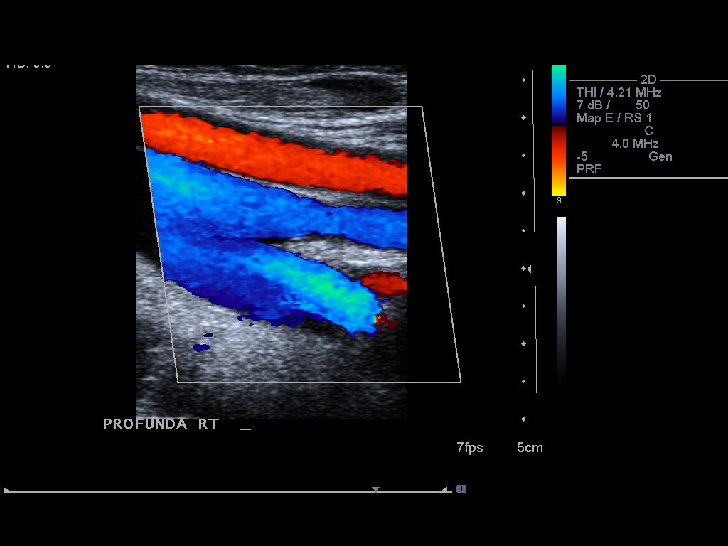
[im 12/46]
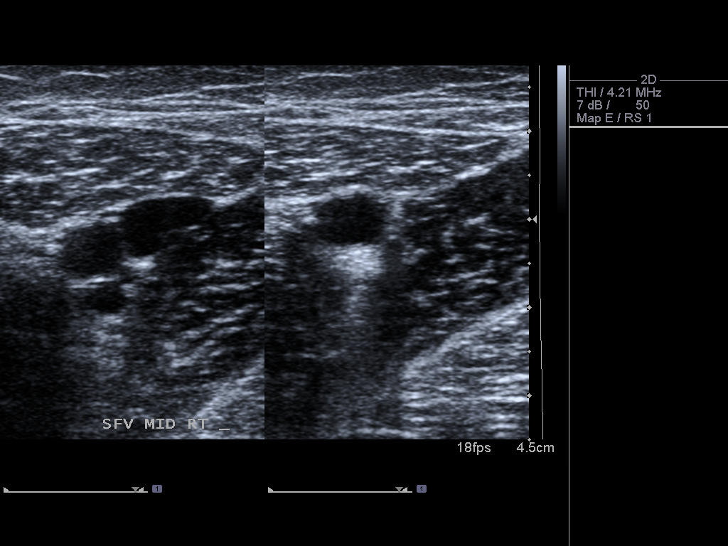
[im 16/46]
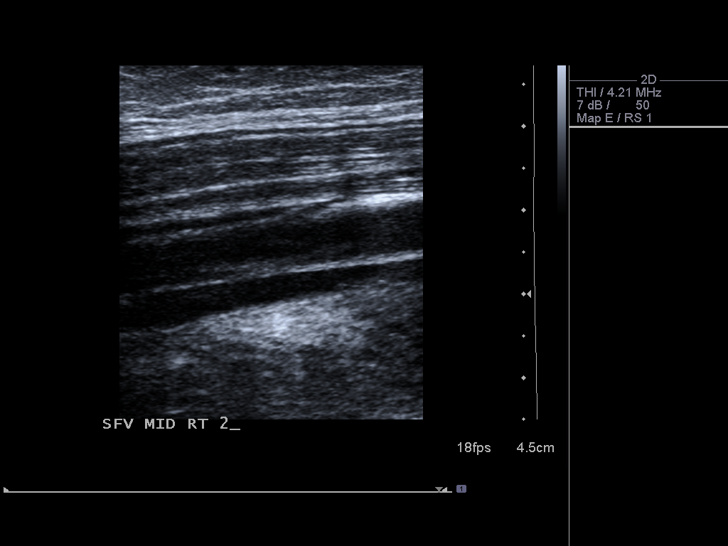
[im 20/46]
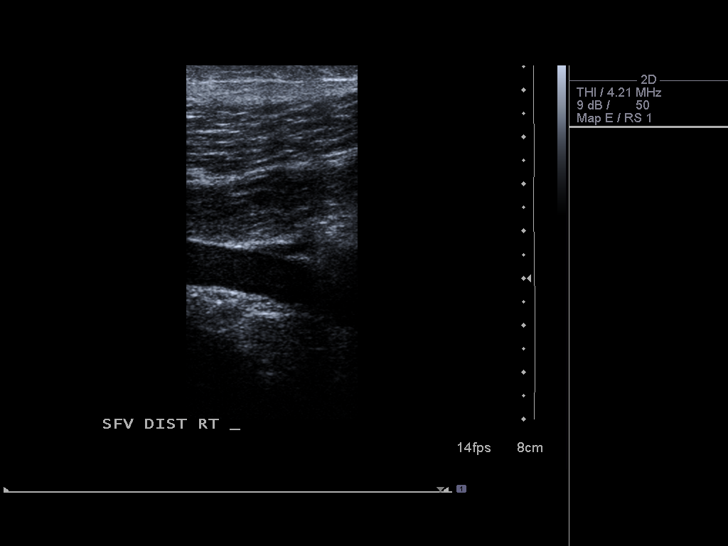
[im 26/46]
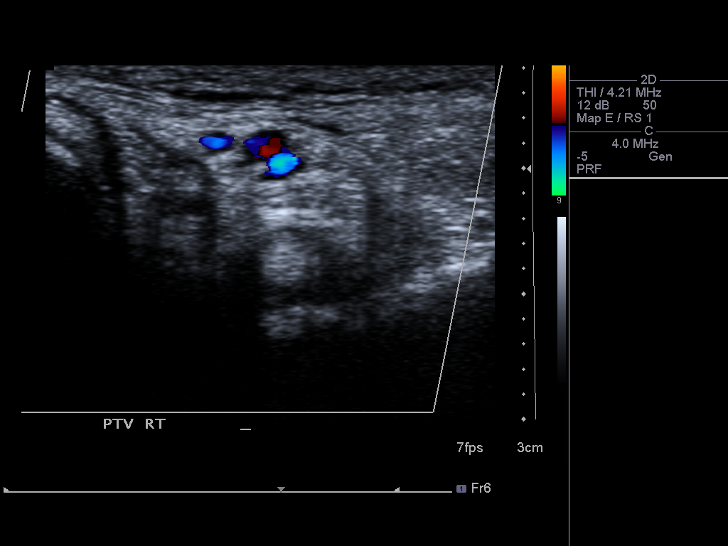
[im 28/46]
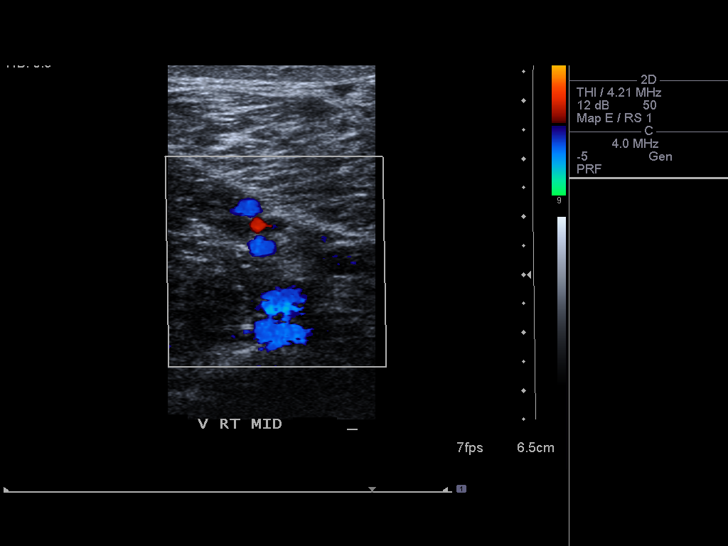
[im 32/46]
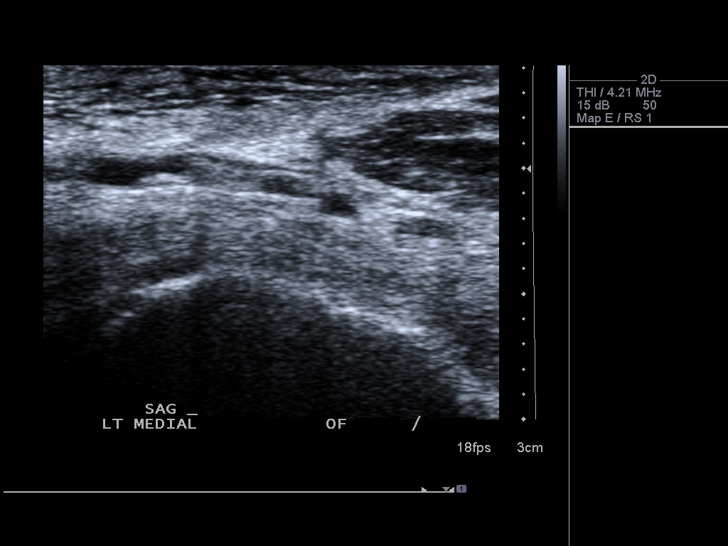
[im 36/46]
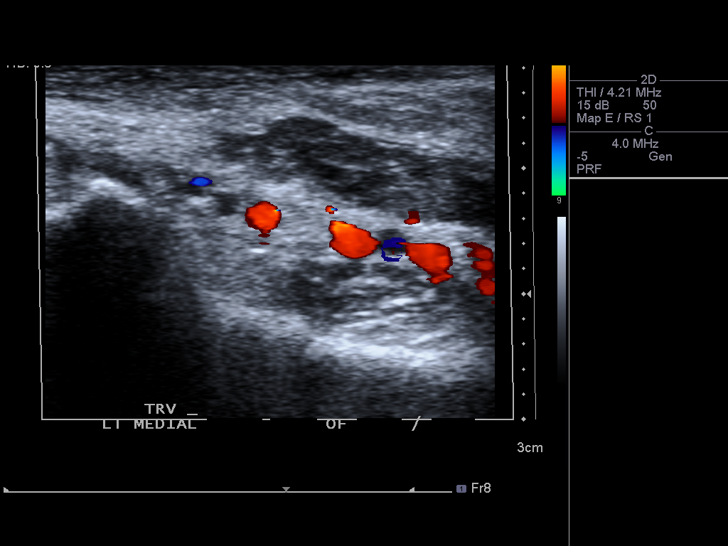
[im 40/46]
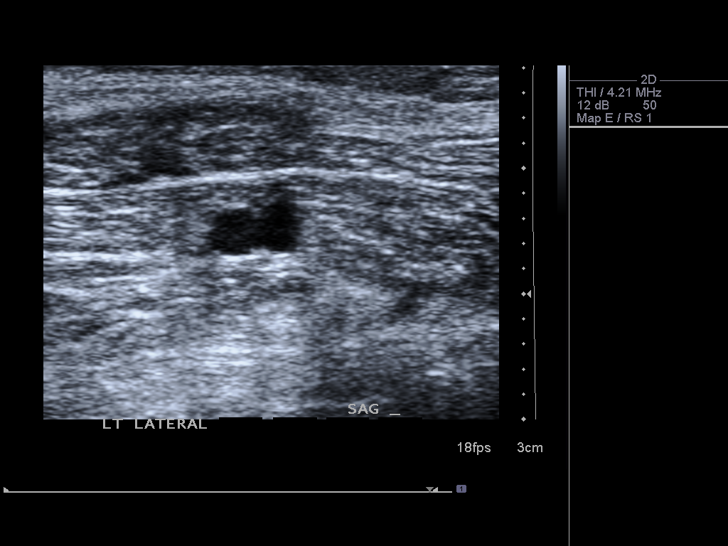
[im 44/46]
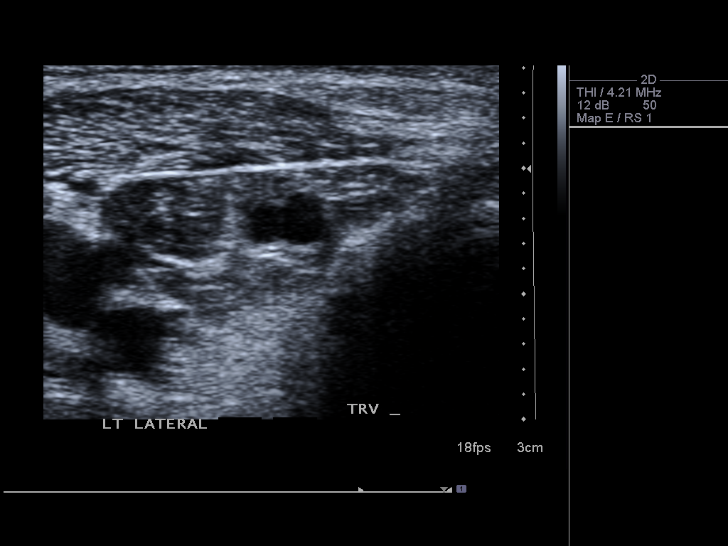
[im 46/46]
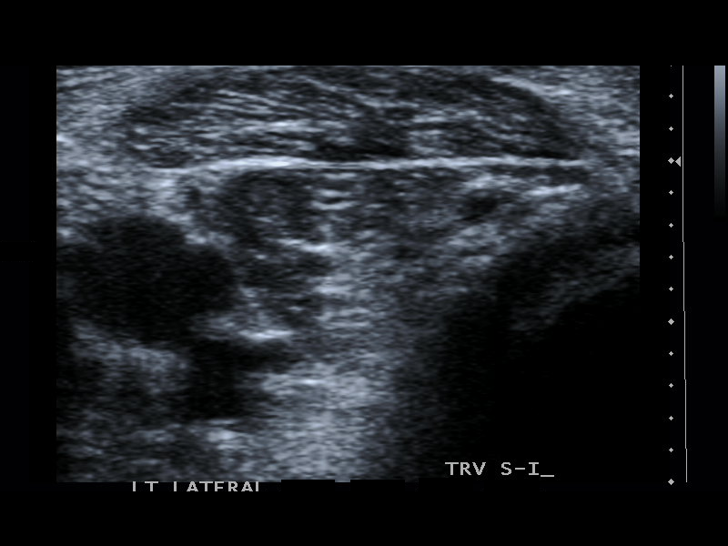

[13 of 24 positions shown; findings below may reference images not displayed]

FINDINGS: Common Femoral Vein: No evidence of thrombus. Normal
compressibility, respiratory phasicity and response to augmentation.

Saphenofemoral Junction: No evidence of thrombus. Normal
compressibility and flow on color Doppler imaging.

Profunda Femoral Vein: No evidence of thrombus. Normal
compressibility and flow on color Doppler imaging.

Femoral Vein: No evidence of thrombus. Normal compressibility,
respiratory phasicity and response to augmentation.

Popliteal Vein: No evidence of thrombus. Normal compressibility,
respiratory phasicity and response to augmentation.

Calf Veins: No evidence of thrombus. Normal compressibility and flow
on color Doppler imaging.

Superficial Great Saphenous Vein: No evidence of thrombus. Normal
compressibility and flow on color Doppler imaging.

Venous Reflux:  None.

Other Findings:  None.
IMPRESSION: No evidence of deep venous thrombosis.

## 2015-10-13 ENCOUNTER — Other Ambulatory Visit: Payer: Self-pay | Admitting: General Practice

## 2015-10-13 MED ORDER — LEVOTHYROXINE SODIUM 75 MCG PO TABS: ORAL_TABLET | ORAL | 1 refills | Status: AC

## 2016-01-31 ENCOUNTER — Encounter: Payer: Managed Care, Other (non HMO) | Admitting: Family Medicine

## 2016-04-04 ENCOUNTER — Other Ambulatory Visit: Payer: Self-pay | Admitting: Family Medicine

## 2017-06-19 ENCOUNTER — Encounter: Payer: Self-pay | Admitting: General Practice
